# Patient Record
Sex: Female | Born: 1990 | Race: White | Hispanic: No | Marital: Single
Health system: Midwestern US, Community
[De-identification: ages and names within clinical notes are randomized; demographics above are authoritative.]

## PROBLEM LIST (undated history)

## (undated) MED FILL — OMEPRAZOLE 20MG CPDR: 20 MG | 30 days supply | Qty: 30 | Fill #0 | Status: AC

## (undated) MED FILL — ESCITALOPRAM OXALATE 20MG TABS: 20 MG | 90 days supply | Qty: 90 | Fill #0 | Status: AC

## (undated) MED FILL — ESCITALOPRAM OXALATE 20MG TABS: 20 MG | 90 days supply | Qty: 90 | Fill #2 | Status: AC

## (undated) MED FILL — NIKKI 3-0.02MG TABS: 3-0.02 MG | 28 days supply | Qty: 28 | Fill #0 | Status: AC

## (undated) MED FILL — NIKKI 3-0.02MG TABS: 3-0.02 MG | 28 days supply | Qty: 28 | Fill #5 | Status: AC

## (undated) MED FILL — NIKKI 3-0.02MG TABS: 3-0.02 MG | 28 days supply | Qty: 28 | Fill #1 | Status: AC

## (undated) MED FILL — NIKKI 3-0.02MG TABS: 3-0.02 MG | 28 days supply | Qty: 28 | Fill #6 | Status: AC

## (undated) MED FILL — ESCITALOPRAM OXALATE 20MG TABS: 20 MG | 90 days supply | Qty: 90 | Fill #1 | Status: AC

---

## 2016-10-25 ENCOUNTER — Emergency Department (HOSPITAL_BASED_OUTPATIENT_CLINIC_OR_DEPARTMENT_OTHER)
Admission: EM | Admit: 2016-10-25 | Discharge: 2016-10-26 | Disposition: A | Discharge status: Home / Self Care | Payer: BLUE CROSS/BLUE SHIELD | Attending: Emergency Medicine | Admitting: Emergency Medicine

## 2016-10-25 ENCOUNTER — Encounter (HOSPITAL_BASED_OUTPATIENT_CLINIC_OR_DEPARTMENT_OTHER): Payer: Self-pay

## 2016-10-25 ENCOUNTER — Emergency Department (HOSPITAL_BASED_OUTPATIENT_CLINIC_OR_DEPARTMENT_OTHER): Payer: BLUE CROSS/BLUE SHIELD

## 2016-10-25 DIAGNOSIS — S99912A Unspecified injury of left ankle, initial encounter: Secondary | ICD-10-CM | POA: Diagnosis present

## 2016-10-25 DIAGNOSIS — Z79899 Other long term (current) drug therapy: Secondary | ICD-10-CM | POA: Diagnosis not present

## 2016-10-25 DIAGNOSIS — Y999 Unspecified external cause status: Secondary | ICD-10-CM | POA: Diagnosis not present

## 2016-10-25 DIAGNOSIS — S93492A Sprain of other ligament of left ankle, initial encounter: Secondary | ICD-10-CM | POA: Insufficient documentation

## 2016-10-25 DIAGNOSIS — W1840XA Slipping, tripping and stumbling without falling, unspecified, initial encounter: Secondary | ICD-10-CM | POA: Insufficient documentation

## 2016-10-25 DIAGNOSIS — Y929 Unspecified place or not applicable: Secondary | ICD-10-CM | POA: Diagnosis not present

## 2016-10-25 DIAGNOSIS — Y9301 Activity, walking, marching and hiking: Secondary | ICD-10-CM | POA: Insufficient documentation

## 2016-10-25 MED ORDER — NAPROXEN 250 MG PO TABS
500.0000 mg | ORAL_TABLET | Freq: Once | ORAL | Status: AC
  Administered 2016-10-25: 500 mg via ORAL
  Filled 2016-10-25: qty 2

## 2016-10-25 MED ORDER — HYDROCODONE-ACETAMINOPHEN 5-325 MG PO TABS
1.0000 | ORAL_TABLET | Freq: Once | ORAL | Status: AC
  Administered 2016-10-25: 1 via ORAL
  Filled 2016-10-25: qty 1

## 2016-10-25 MED ORDER — IBUPROFEN 600 MG PO TABS: 600.0000 mg | ORAL_TABLET | Freq: Four times a day (QID) | ORAL | 0 refills | Status: AC | PRN

## 2016-10-25 NOTE — ED Triage Notes (Signed)
Left ankle pain since hiking yesterday. (+)swelling, pain and bruising

## 2016-10-25 NOTE — ED Provider Notes (Signed)
[facility] Provider Note   CSN: [id number] Arrival date & time: 10/25/16  2140   By signing my name below, I, Clovis Pu, attest that this documentation has been prepared under the direction and in the presence of Shon Baton, MD  Electronically Signed: Clovis Pu, ED Scribe. 10/25/16. 11:27 PM.   History   Chief Complaint Chief Complaint  Patient presents with  . Ankle Pain   The history is provided by the patient. No language interpreter was used.   HPI Comments:  Amy Erickson is a 26 y.o. female who presents to the Emergency Department complaining of acute onset, worsened, moderate, left ankle pain s/p an incident which occurred yesterday. She notes her pain severity is a "3/10" at rest and a "7/10" during ambulation. Pt notes she was hiking, tripped over a rock and rolled her ankle. Her pain is worse with ambulation. She has taken ibuprofen with no relief. Pt denies knee pain, any major medical problems or any other associated symptoms.   History reviewed. No pertinent past medical history.  There are no active problems to display for this patient.   History reviewed. No pertinent surgical history.  OB History    No data available       Home Medications    Prior to Admission medications   Medication Sig Start Date End Date Taking? Authorizing Provider  PARoxetine (PAXIL) 20 MG tablet Take 20 mg by mouth daily.   Yes Historical Provider, MD  phentermine 30 MG capsule Take 30 mg by mouth every morning.   Yes Historical Provider, MD  ibuprofen (ADVIL,MOTRIN) 600 MG tablet Take 1 tablet (600 mg total) by mouth every 6 (six) hours as needed. 10/25/16   Shon Baton, MD    Family History No family history on file.  Social History Social History  Substance Use Topics  . Smoking status: Never Smoker  . Smokeless tobacco: Never Used  . Alcohol use Yes     Allergies   Patient has no known allergies.   Review of Systems Review of  Systems  Musculoskeletal: Positive for joint swelling and myalgias.  Skin: Positive for color change.  All other systems reviewed and are negative.  Physical Exam Updated Vital Signs BP (!) 128/95 (BP Location: Left Arm)   Pulse 86   Temp 98.2 F (36.8 C) (Oral)   Resp 18   Ht  (1.626 m)   Wt 175 lb (79.4 kg)   LMP 09/27/2016   SpO2 100%   BMI 30.04 kg/m   Physical Exam  Constitutional: She is oriented to person, place, and time. She appears well-developed and well-nourished. No distress.  HENT:  Head: Normocephalic and atraumatic.  Cardiovascular: Normal rate.   Pulmonary/Chest: Effort normal. No respiratory distress.  Musculoskeletal:  Focused examination of the left ankle reveals decreased range of motion, there is extensive swelling and bruising from the lateral aspect of the foot superiorly just above the lateral malleolus, no proximal fibular tenderness, 2+ DP pulse, Achilles tendon intact  Neurological: She is alert and oriented to person, place, and time.  Skin: Skin is warm and dry.  Psychiatric: She has a normal mood and affect.  Nursing note and vitals reviewed.    ED Treatments / Results  DIAGNOSTIC STUDIES:  Oxygen Saturation is 100% on RA, normal by my interpretation.    COORDINATION OF CARE:  11:26 PM Discussed treatment plan with pt at bedside and pt agreed to plan.  Labs (all labs ordered are listed,  but only abnormal results are displayed) Labs Reviewed - No data to display  EKG  EKG Interpretation None       Radiology Dg Ankle Complete Left  Result Date: 10/25/2016 CLINICAL DATA:  Ankle injury while hiking EXAM: LEFT ANKLE COMPLETE - 3+ VIEW COMPARISON:  None. FINDINGS: There is circumferential soft tissue swelling at the left ankle. The ankle mortise is approximated. He there is no fracture or dislocation. No ankle effusion. IMPRESSION: No acute fracture or dislocation of the left ankle. Electronically Signed   By: Deatra Robinson M.D.    On: 10/25/2016 22:46   Dg Foot Complete Left  Result Date: 10/25/2016 CLINICAL DATA:  Ankle injury while hiking EXAM: LEFT FOOT - COMPLETE 3+ VIEW COMPARISON:  None. FINDINGS: There is no evidence of fracture or dislocation. There is no evidence of arthropathy or other focal bone abnormality. Soft tissues are unremarkable. IMPRESSION: No fracture or dislocation of the left foot. Electronically Signed   By: Deatra Robinson M.D.   On: 10/25/2016 22:47    Procedures Procedures (including critical care time)  Medications Ordered in ED Medications  HYDROcodone-acetaminophen (NORCO/VICODIN) 5-325 MG per tablet 1 tablet (1 tablet Oral Given 10/25/16 2332)  naproxen (NAPROSYN) tablet 500 mg (500 mg Oral Given 10/25/16 2332)     Initial Impression / Assessment and Plan / ED Course  I have reviewed the triage vital signs and the nursing notes.  Pertinent labs & imaging results that were available during my care of the patient were reviewed by me and considered in my medical decision making (see chart for details).     Patient presents with left ankle injury. This occurred yesterday. Significant swelling and bruising over the lateral aspect of the ankle. X-rays are negative. Suspect high ankle sprain. Recommend rest, ice, compression, elevation. Patient was placed in an ASO brace. She was also given crutches for comfort. She is not from [place]. Follow-up with primary physician or sports medicine in hometown if symptoms worsen.  After history, exam, and medical workup I feel the patient has been appropriately medically screened and is safe for discharge home. Pertinent diagnoses were discussed with the patient. Patient was given return precautions.   Final Clinical Impressions(s) / ED Diagnoses   Final diagnoses:  Sprain of anterior talofibular ligament of left ankle, initial encounter    New Prescriptions New Prescriptions   IBUPROFEN (ADVIL,MOTRIN) 600 MG TABLET    Take 1 tablet (600 mg  total) by mouth every 6 (six) hours as needed.   I personally performed the services described in this documentation, which was scribed in my presence. The recorded information has been reviewed and is accurate.     Shon Baton, MD 10/26/16 0000

## 2016-10-25 NOTE — Discharge Instructions (Signed)
You likely have a high ankle sprain. Given the extent of swelling and bruising, recommend minimal weightbearing for the next 1-2 days with rest, ice, compression, and elevation. He will be given a brace and crutches. Following that timeframe, weight-bear as tolerated. Follow-up with sports medicine in your hometown for repeat evaluation.

## 2018-01-18 ENCOUNTER — Ambulatory Visit: Admit: 2018-01-18 | Payer: PRIVATE HEALTH INSURANCE | Attending: Family | Primary: Family Medicine

## 2018-01-18 DIAGNOSIS — H6593 Unspecified nonsuppurative otitis media, bilateral: Secondary | ICD-10-CM

## 2018-01-18 MED ORDER — AMOXICILLIN 875 MG TAB
875 mg | ORAL_TABLET | Freq: Two times a day (BID) | ORAL | 0 refills | Status: AC
Start: 2018-01-18 — End: 2018-01-28

## 2018-01-18 MED ORDER — LEVOCETIRIZINE 5 MG TAB
5 mg | ORAL_TABLET | Freq: Every day | ORAL | 0 refills | Status: AC
Start: 2018-01-18 — End: 2018-02-17

## 2018-01-18 NOTE — Progress Notes (Signed)
Identified pt with two pt identifiers(name and DOB). Reviewed record in preparation for visit and have obtained necessary documentation.  Chief Complaint   Patient presents with   . Sinus Infection     X 6 days      Visit Vitals  BP 118/80 (BP 1 Location: Left arm, BP Patient Position: Sitting)   Pulse 92   Temp 97.7 F (36.5 C) (Oral)   Resp 18   Ht 5\' 4"  (1.626 m)   Wt 207 lb (93.9 kg)   LMP 01/18/2018   SpO2 98%   BMI 35.53 kg/m        Health Maintenance Due   Topic   . HPV Age 9Y-26Y (1 - Female 3-dose series)   . DTaP/Tdap/Td series (1 - Tdap)   . PAP AKA CERVICAL CYTOLOGY        Coordination of Care Questionnaire:  :   1) Have you been to an emergency room, urgent care, or hospitalized since your last visit?  If yes, where when, and reason for visit? no       2. Have seen or consulted any other health care provider since your last visit?   If yes, where when, and reason for visit?  NO      3) Do you have an Advanced Directive/ Living Will in place? NO  If yes, do we have a copy on file NO  If no, would you like information NO    Patient is accompanied by self I have received verbal consent from Rebekah Kirby to discuss any/all medical information while they are present in the room.

## 2018-01-18 NOTE — Progress Notes (Signed)
Subjective:      Rebekah Kirby is a 27 y.o. female who presents for possible ear infection. Symptoms include bilateral ear pain. Onset of symptoms was 6 days ago, gradually worsening since that time. Associated symptoms include bilateral ear pressure/pain, congestion and facial pain, which have been present for 6 days . She is drinking plenty of fluids.  History reviewed. No pertinent past medical history.  Current Outpatient Medications   Medication Sig Dispense Refill   ??? escitalopram oxalate (LEXAPRO) 20 mg tablet Take 20 mg by mouth daily.     ??? drospirenone-ethinyl estradiol (NIKKI, 28,) 3-0.02 mg tab Take  by mouth daily.     ??? amoxicillin (AMOXIL) 875 mg tablet Take 1 Tab by mouth two (2) times a day for 10 days. 20 Tab 0   ??? levocetirizine (XYZAL) 5 mg tablet Take 1 Tab by mouth daily for 30 days. 30 Tab 0     Allergies   Allergen Reactions   ??? Bactrim [Sulfamethoprim] Hives     Social History     Socioeconomic History   ??? Marital status: SINGLE     Spouse name: Not on file   ??? Number of children: Not on file   ??? Years of education: Not on file   ??? Highest education level: Not on file   Occupational History   ??? Not on file   Social Needs   ??? Financial resource strain: Not on file   ??? Food insecurity:     Worry: Not on file     Inability: Not on file   ??? Transportation needs:     Medical: Not on file     Non-medical: Not on file   Tobacco Use   ??? Smoking status: Former Smoker   ??? Smokeless tobacco: Never Used   Substance and Sexual Activity   ??? Alcohol use: Yes     Alcohol/week: 0.6 oz     Types: 1 Glasses of wine per week     Frequency: Never     Drinks per session: 1 or 2     Binge frequency: Never   ??? Drug use: Not on file   ??? Sexual activity: Yes     Partners: Male     Birth control/protection: Pill   Lifestyle   ??? Physical activity:     Days per week: Not on file     Minutes per session: Not on file   ??? Stress: Not on file   Relationships   ??? Social connections:     Talks on phone: Not on file     Gets  together: Not on file     Attends religious service: Not on file     Active member of club or organization: Not on file     Attends meetings of clubs or organizations: Not on file     Relationship status: Not on file   ??? Intimate partner violence:     Fear of current or ex partner: Not on file     Emotionally abused: Not on file     Physically abused: Not on file     Forced sexual activity: Not on file   Other Topics Concern   ??? Not on file   Social History Narrative   ??? Not on file     Review of Systems  A comprehensive review of systems was negative except for that written in the HPI.    Objective:     Visit Vitals  BP 118/80 (  BP 1 Location: Left arm, BP Patient Position: Sitting)   Pulse 92   Temp 97.7 ??F (36.5 ??C) (Oral)   Resp 18   Ht 5\' 4"  (1.626 m)   Wt 207 lb (93.9 kg)   LMP 01/18/2018   SpO2 98%   BMI 35.53 kg/m??     General:  alert, cooperative, no distress, appears stated age   Head:  NCAT w/o lesions or tenderness   Eyes: conjunctivae/corneas clear. PERRL, EOM's intact. Fundi benign   Right Ear: right TM erythematous, dull   Left Ear: left TM erythematous, dull   Mouth:  Lips, mucosa, and tongue normal. Teeth and gums normal   Neck: supple, symmetrical, trachea midline, no adenopathy, thyroid: not enlarged, symmetric, no tenderness/mass/nodules, no carotid bruit and no JVD.   Lungs: clear to auscultation bilaterally   Heart:  regular rate and rhythm, S1, S2 normal, no murmur, click, rub or gallop   Skin: no rash or abnormalities        Assessment/Plan:     Acute bilateral otitis media    1.  Treatment:  Amoxicillin  2.  OTC meds (ibuprofen- doses discussed), fluids, rest, avoid carbonated/ alcoholic, and caffeinated beverages.   3.  Follow up with PCP in 1 week if not improving.      ICD-10-CM ICD-9-CM    1. Bilateral non-suppurative otitis media H65.93 381.4 amoxicillin (AMOXIL) 875 mg tablet      levocetirizine (XYZAL) 5 mg tablet     reviewed diet, exercise and weight control  reviewed medications and  side effects in detail.    Visit Vitals  BP 118/80 (BP 1 Location: Left arm, BP Patient Position: Sitting)   Pulse 92   Temp 97.7 ??F (36.5 ??C) (Oral)   Resp 18   Ht 5\' 4"  (1.626 m)   Wt 207 lb (93.9 kg)   LMP 01/18/2018   SpO2 98%   BMI 35.53 kg/m??     Spoke with the patient regarding their blood pressure (BP) reading at today's visit.  The patient verbalized understanding of need to maintain BP lower than 140/90.  The patient will follow up with their primary care physician regarding management and/or medications that may be needed.    Drepssion Screening has been completed.  The patient isreports having a history of depression.  The patient istaking medication and is being followed by their PCP at this time.    This patient does  have a primary care physician.  Referral was not given a referral at todays visit.    Immunizations:  The patient is current on their influenza immunization at this time.  The patient does not   want to receive the influenza immunization today.    Follow up instructions given at today's visit were verbalized by the patient/parent.  The signs of infection are fever > 100.4, increase fatigue, change in mental status, or decrease in urinary output.  The patient/parent verbalized understanding of taking medications prescribed during this visit as prescribed.

## 2018-01-18 NOTE — Progress Notes (Signed)
Subjective:      Rebekah Kirby is a 27 y.o. female who presents for possible ear infection. Symptoms include bilateral ear pain. Onset of symptoms was 6 days ago, gradually worsening since that time. Associated symptoms include bilateral ear pressure/pain, congestion and facial pain, which have been present for 6 days . She is drinking plenty of fluids.  History reviewed. No pertinent past medical history.  Current Outpatient Medications   Medication Sig Dispense Refill   ??? escitalopram oxalate (LEXAPRO) 20 mg tablet Take 20 mg by mouth daily.     ??? drospirenone-ethinyl estradiol (NIKKI, 28,) 3-0.02 mg tab Take  by mouth daily.     ??? amoxicillin (AMOXIL) 875 mg tablet Take 1 Tab by mouth two (2) times a day for 10 days. 20 Tab 0   ??? levocetirizine (XYZAL) 5 mg tablet Take 1 Tab by mouth daily for 30 days. 30 Tab 0     Allergies   Allergen Reactions   ??? Bactrim [Sulfamethoprim] Hives     Social History     Socioeconomic History   ??? Marital status: SINGLE     Spouse name: Not on file   ??? Number of children: Not on file   ??? Years of education: Not on file   ??? Highest education level: Not on file   Occupational History   ??? Not on file   Social Needs   ??? Financial resource strain: Not on file   ??? Food insecurity:     Worry: Not on file     Inability: Not on file   ??? Transportation needs:     Medical: Not on file     Non-medical: Not on file   Tobacco Use   ??? Smoking status: Former Smoker   ??? Smokeless tobacco: Never Used   Substance and Sexual Activity   ??? Alcohol use: Yes     Alcohol/week: 0.6 oz     Types: 1 Glasses of wine per week     Frequency: Never     Drinks per session: 1 or 2     Binge frequency: Never   ??? Drug use: Not on file   ??? Sexual activity: Yes     Partners: Male     Birth control/protection: Pill   Lifestyle   ??? Physical activity:     Days per week: Not on file     Minutes per session: Not on file   ??? Stress: Not on file   Relationships   ??? Social connections:     Talks on phone: Not on file      Gets together: Not on file     Attends religious service: Not on file     Active member of club or organization: Not on file     Attends meetings of clubs or organizations: Not on file     Relationship status: Not on file   ??? Intimate partner violence:     Fear of current or ex partner: Not on file     Emotionally abused: Not on file     Physically abused: Not on file     Forced sexual activity: Not on file   Other Topics Concern   ??? Not on file   Social History Narrative   ??? Not on file     Review of Systems  A comprehensive review of systems was negative except for that written in the HPI.    Objective:     Visit Vitals  BP 118/80 (  BP 1 Location: Left arm, BP Patient Position: Sitting)   Pulse 92   Temp 97.7 ??F (36.5 ??C) (Oral)   Resp 18   Ht 5\' 4"  (1.626 m)   Wt 207 lb (93.9 kg)   LMP 01/18/2018   SpO2 98%   BMI 35.53 kg/m??     General:  alert, cooperative, no distress, appears stated age   Head:  NCAT w/o lesions or tenderness   Eyes: conjunctivae/corneas clear. PERRL, EOM's intact. Fundi benign   Right Ear: right TM erythematous, dull   Left Ear: left TM erythematous, dull   Mouth:  Lips, mucosa, and tongue normal. Teeth and gums normal   Neck: supple, symmetrical, trachea midline, no adenopathy, thyroid: not enlarged, symmetric, no tenderness/mass/nodules, no carotid bruit and no JVD.   Lungs: clear to auscultation bilaterally   Heart:  regular rate and rhythm, S1, S2 normal, no murmur, click, rub or gallop   Skin: no rash or abnormalities        Assessment/Plan:     Acute bilateral otitis media    1.  Treatment:  Amoxicillin  2.  OTC meds (ibuprofen- doses discussed), fluids, rest, avoid carbonated/ alcoholic, and caffeinated beverages.   3.  Follow up with PCP in 1 week if not improving.      ICD-10-CM ICD-9-CM    1. Bilateral non-suppurative otitis media H65.93 381.4 amoxicillin (AMOXIL) 875 mg tablet      levocetirizine (XYZAL) 5 mg tablet     reviewed diet, exercise and weight control   reviewed medications and side effects in detail.    Visit Vitals  BP 118/80 (BP 1 Location: Left arm, BP Patient Position: Sitting)   Pulse 92   Temp 97.7 ??F (36.5 ??C) (Oral)   Resp 18   Ht 5\' 4"  (1.626 m)   Wt 207 lb (93.9 kg)   LMP 01/18/2018   SpO2 98%   BMI 35.53 kg/m??     Spoke with the patient regarding their blood pressure (BP) reading at today's visit.  The patient verbalized understanding of need to maintain BP lower than 140/90.  The patient will follow up with their primary care physician regarding management and/or medications that may be needed.    Drepssion Screening has been completed.  The patient isreports having a history of depression.  The patient istaking medication and is being followed by their PCP at this time.    This patient does  have a primary care physician.  Referral was not given a referral at todays visit.    Immunizations:  The patient is current on their influenza immunization at this time.  The patient does not   want to receive the influenza immunization today.    Follow up instructions given at today's visit were verbalized by the patient/parent.  The signs of infection are fever > 100.4, increase fatigue, change in mental status, or decrease in urinary output.  The patient/parent verbalized understanding of taking medications prescribed during this visit as prescribed.

## 2018-01-18 NOTE — Patient Instructions (Signed)
Ear Infection (Otitis Media): Care Instructions  Your Care Instructions    An ear infection may start with a cold and affect the middle ear (otitis media). It can hurt a lot. Most ear infections clear up on their own in a couple of days. Most often you will not need antibiotics. This is because many ear infections are caused by a virus. Antibiotics don't work against a virus. Regular doses of pain medicines are the best way to reduce your fever and help you feel better.  Follow-up care is a key part of your treatment and safety. Be sure to make and go to all appointments, and call your doctor if you are having problems. It's also a good idea to know your test results and keep a list of the medicines you take.  How can you care for yourself at home?  ?? Take pain medicines exactly as directed.  ? If the doctor gave you a prescription medicine for pain, take it as prescribed.  ? If you are not taking a prescription pain medicine, take an over-the-counter medicine, such as acetaminophen (Tylenol), ibuprofen (Advil, Motrin), or naproxen (Aleve). Read and follow all instructions on the label.  ? Do not take two or more pain medicines at the same time unless the doctor told you to. Many pain medicines have acetaminophen, which is Tylenol. Too much acetaminophen (Tylenol) can be harmful.  ?? Plan to take a full dose of pain reliever before bedtime. Getting enough sleep will help you get better.  ?? Try a warm, moist washcloth on the ear. It may help relieve pain.  ?? If your doctor prescribed antibiotics, take them as directed. Do not stop taking them just because you feel better. You need to take the full course of antibiotics.  When should you call for help?  Call your doctor now or seek immediate medical care if:  ?? ?? You have new or increasing ear pain.   ?? ?? You have new or increasing pus or blood draining from your ear.   ?? ?? You have a fever with a stiff neck or a severe headache.    ??Watch closely for changes in your health, and be sure to contact your doctor if:  ?? ?? You have new or worse symptoms.   ?? ?? You are not getting better after taking an antibiotic for 2 days.   Where can you learn more?  Go to http://www.healthwise.net/[employer].  Enter X558 in the search box to learn more about "Ear Infection (Otitis Media): Care Instructions."  Current as of: November 03, 2016  Content Version: 11.9  ?? 2006-2018 [employer]. Care instructions adapted under license by [employer] (which disclaims liability or warranty for this information). If you have questions about a medical condition or this instruction, always ask your healthcare professional. [employer] disclaims any warranty or liability for your use of this information.

## 2018-01-18 NOTE — Progress Notes (Signed)
Identified pt with two pt identifiers(name and DOB). Reviewed record in preparation for visit and have obtained necessary documentation.  Chief Complaint   Patient presents with   ??? Sinus Infection     X 6 days      Visit Vitals  BP 118/80 (BP 1 Location: Left arm, BP Patient Position: Sitting)   Pulse 92   Temp 97.7 ??F (36.5 ??C) (Oral)   Resp 18   Ht 5' 4" (1.626 m)   Wt 207 lb (93.9 kg)   LMP 01/18/2018   SpO2 98%   BMI 35.53 kg/m??        Health Maintenance Due   Topic   ??? HPV Age 9Y-26Y (1 - Female 3-dose series)   ??? DTaP/Tdap/Td series (1 - Tdap)   ??? PAP AKA CERVICAL CYTOLOGY        Coordination of Care Questionnaire:  :   1) Have you been to an emergency room, urgent care, or hospitalized since your last visit?  If yes, where when, and reason for visit? no       2. Have seen or consulted any other health care provider since your last visit?   If yes, where when, and reason for visit?  NO      3) Do you have an Advanced Directive/ Living Will in place? NO  If yes, do we have a copy on file NO  If no, would you like information NO    Patient is accompanied by self I have received verbal consent from Rebekah Kirby to discuss any/all medical information while they are present in the room.

## 2018-01-22 IMAGING — DX DG ANKLE COMPLETE 3+V*L*
3 series · 3 of 3 positions shown · non-contrast
Comparison: None.

CLINICAL DATA: Ankle injury while hiking

EXAM:
LEFT ANKLE COMPLETE - 3+ VIEW

[ankle ap]
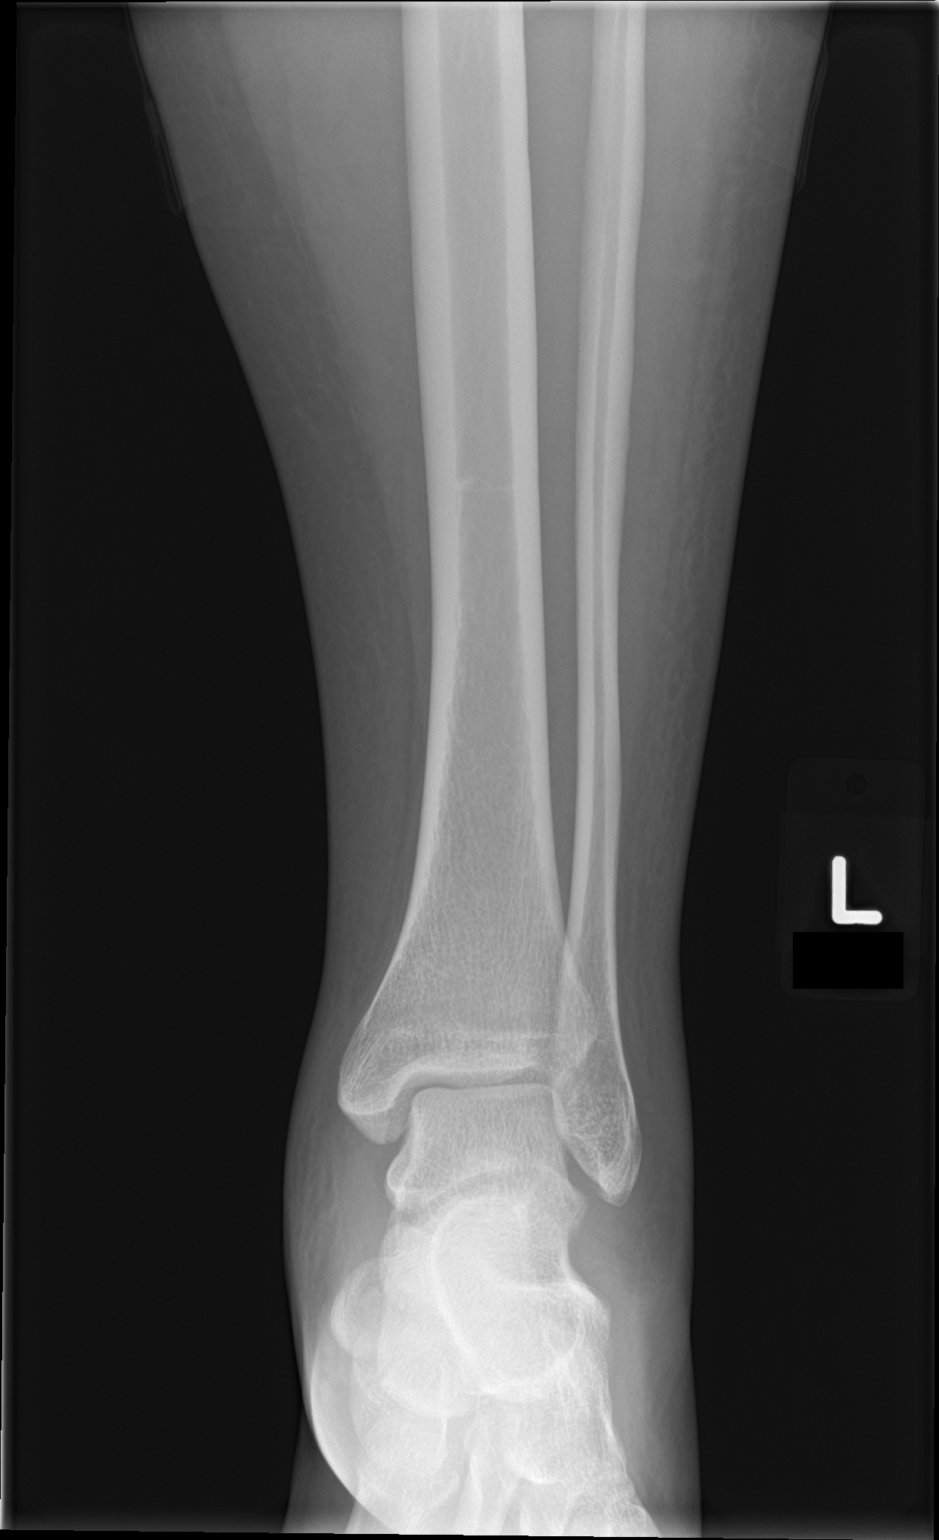

[ankle obl]
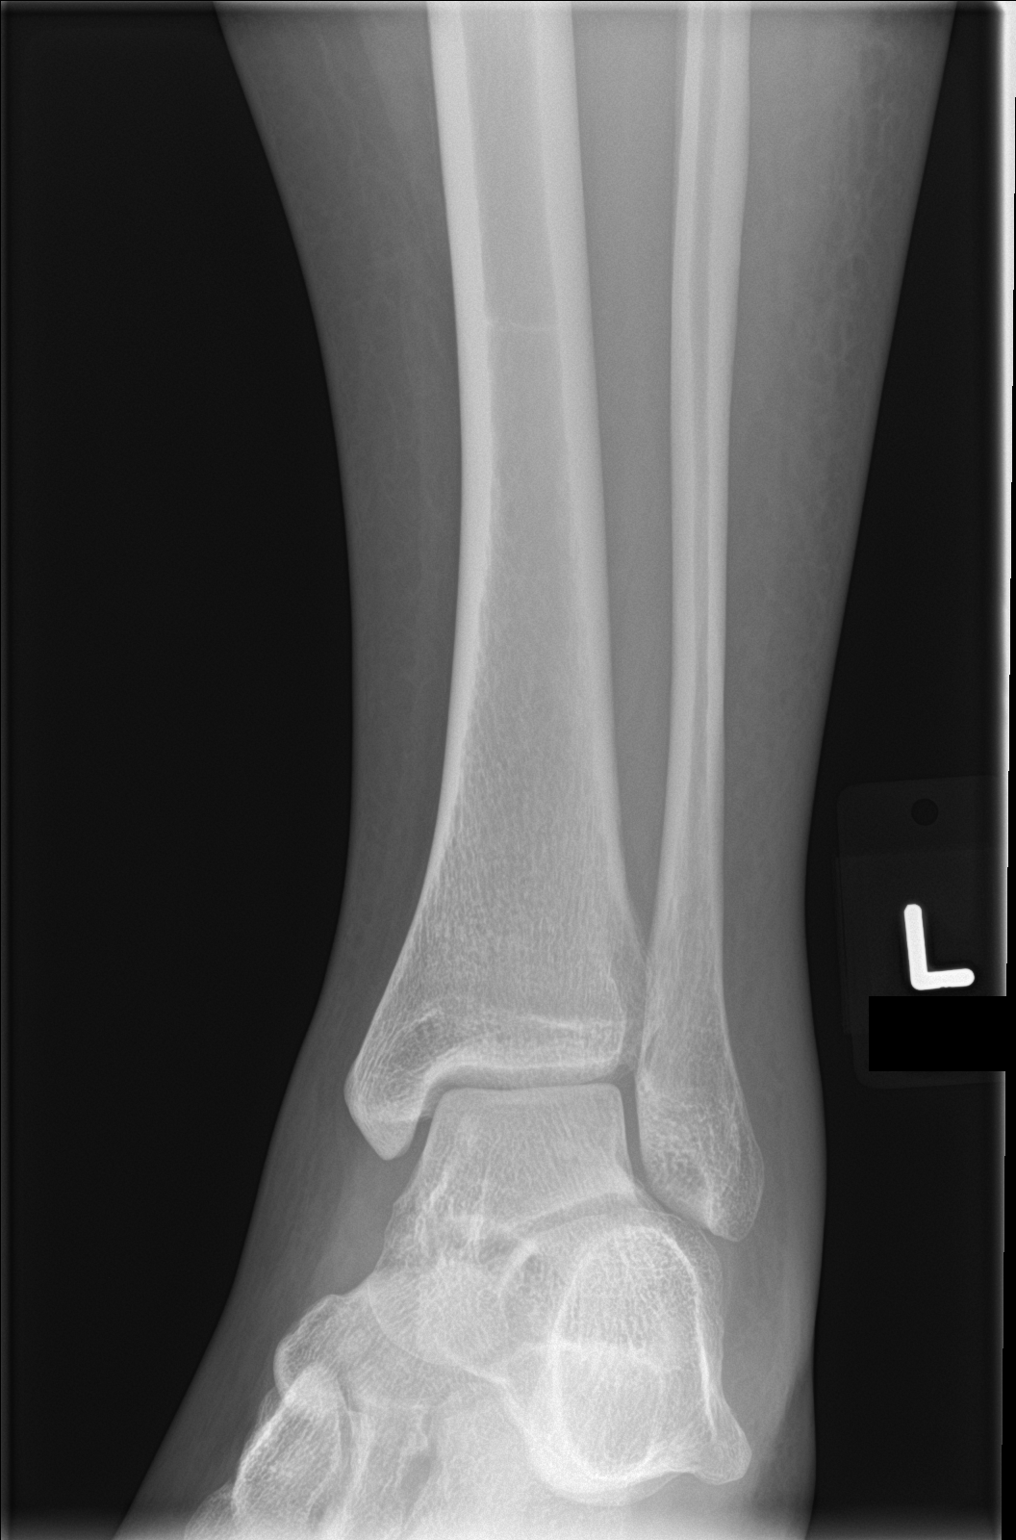

[ankle lat]
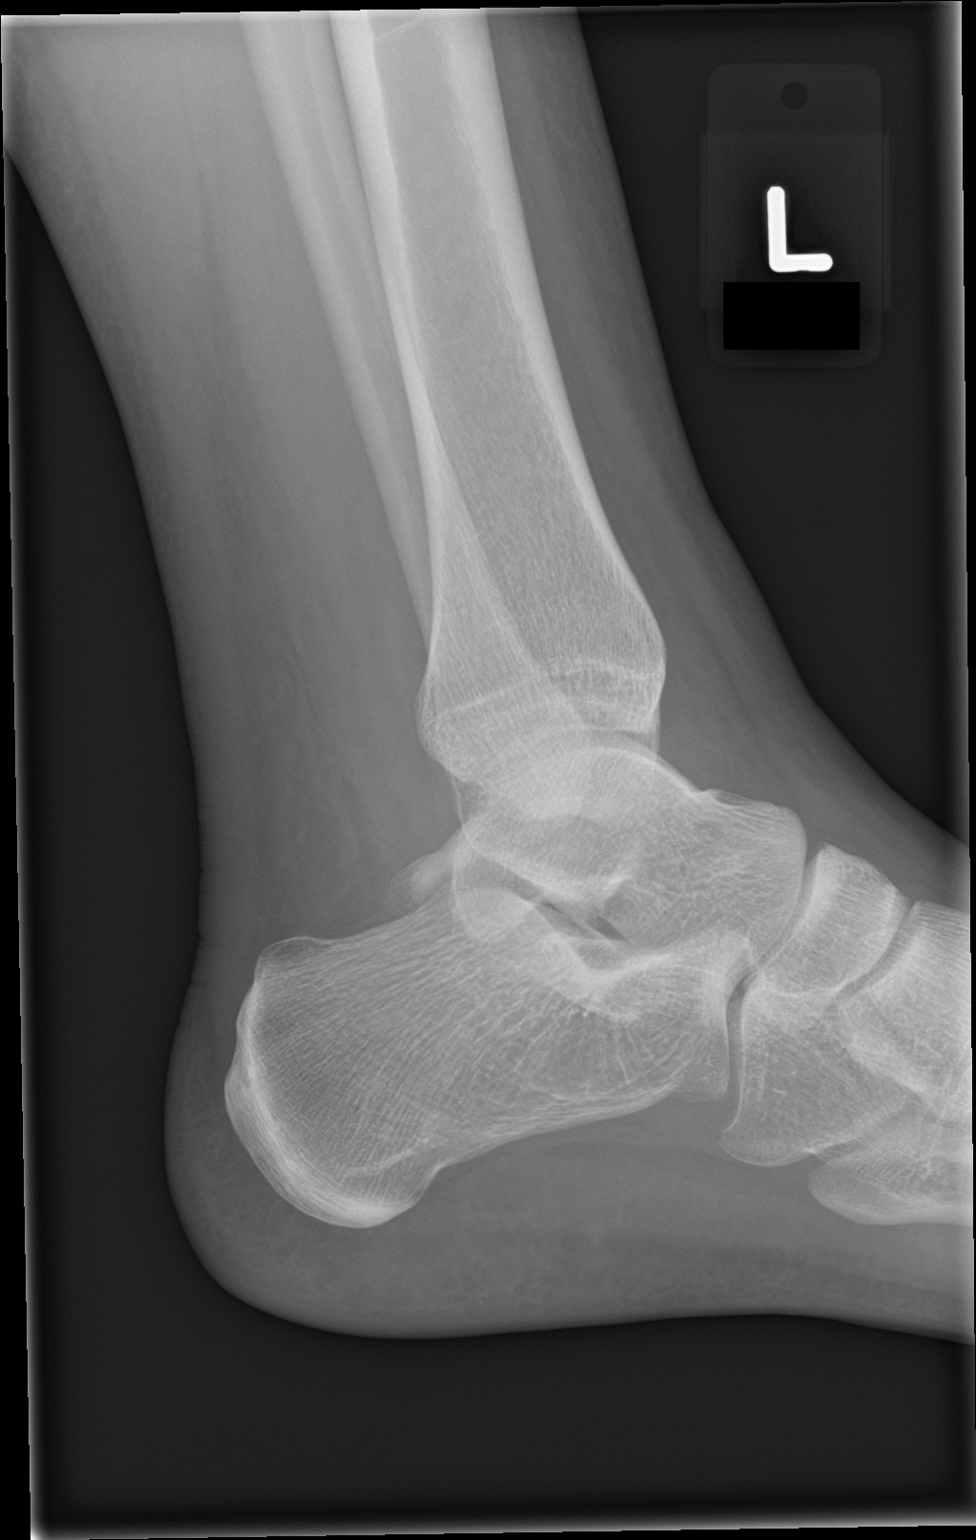

[3 of 3 positions shown; findings below may reference images not displayed]

FINDINGS: There is circumferential soft tissue swelling at the left ankle. The
ankle mortise is approximated. He there is no fracture or
dislocation. No ankle effusion.
IMPRESSION: No acute fracture or dislocation of the left ankle.

## 2018-01-22 IMAGING — DX DG FOOT COMPLETE 3+V*L*
3 series · 3 of 3 positions shown · non-contrast
Comparison: None.

CLINICAL DATA: Ankle injury while hiking

EXAM:
LEFT FOOT - COMPLETE 3+ VIEW

[foot ap]
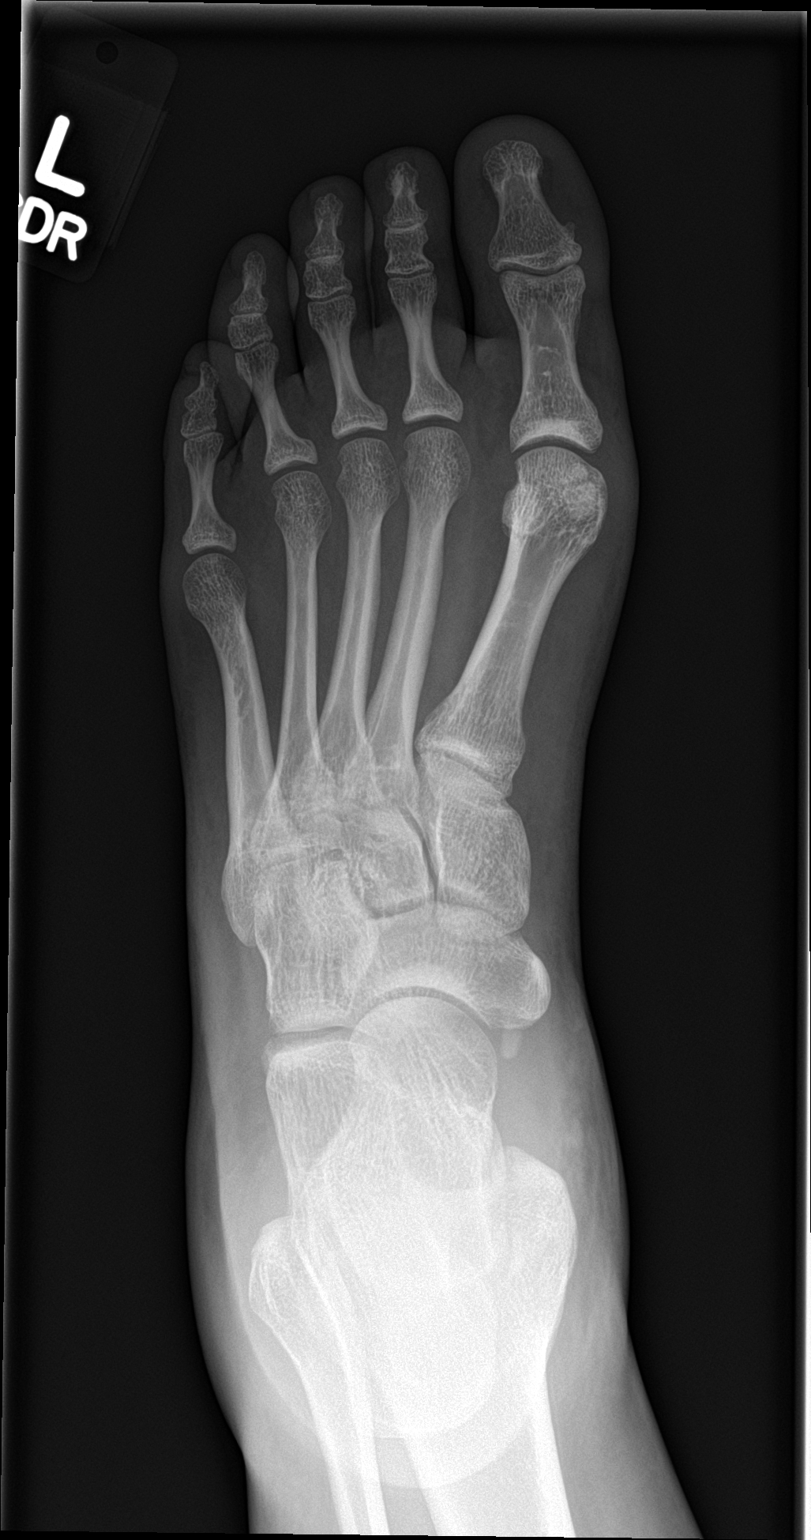

[foot obl]
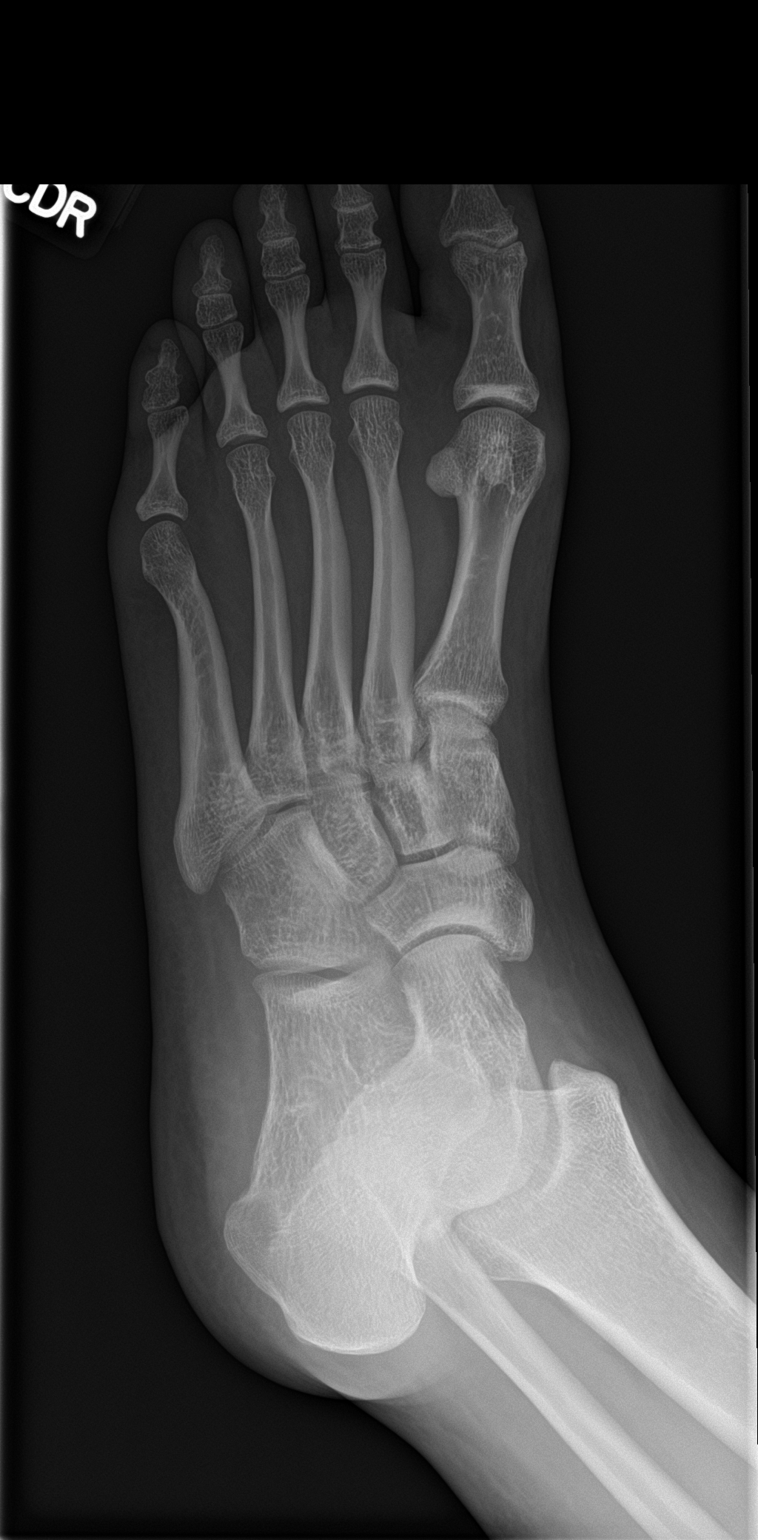

[foot lat]
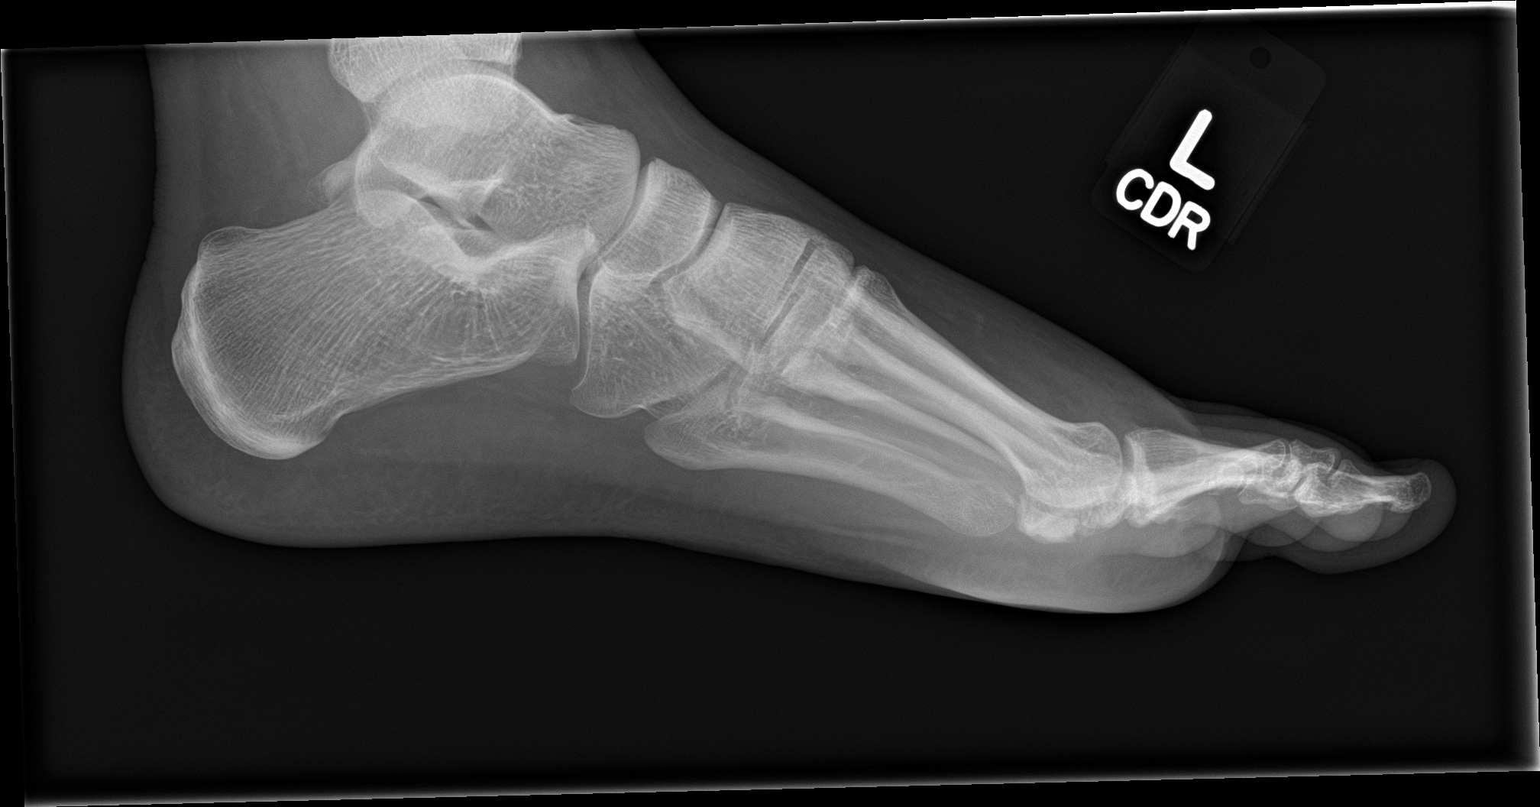

[3 of 3 positions shown; findings below may reference images not displayed]

FINDINGS: There is no evidence of fracture or dislocation. There is no
evidence of arthropathy or other focal bone abnormality. Soft
tissues are unremarkable.
IMPRESSION: No fracture or dislocation of the left foot.

## 2018-12-22 MED FILL — NIKKI 3-0.02MG TABS: 3-0.02 mg | 28 days supply | Qty: 28 | Fill #5 | Status: AC

## 2019-02-07 MED FILL — NIKKI 3-0.02MG TABS: 3-0.02 mg | 28 days supply | Qty: 28 | Fill #6 | Status: AC

## 2019-04-12 ENCOUNTER — Ambulatory Visit: Admit: 2019-04-12 | Discharge: 2019-04-13 | Payer: PRIVATE HEALTH INSURANCE | Primary: Family Medicine

## 2019-04-13 MED FILL — ESCITALOPRAM OXALATE 20MG TABS: 20 mg | 90 days supply | Qty: 90 | Fill #2 | Status: AC

## 2019-07-20 MED FILL — ESCITALOPRAM OXALATE 20MG TABS: 20 mg | 90 days supply | Qty: 90 | Fill #1 | Status: AC

## 2019-10-17 MED FILL — NIKKI 3-0.02MG TABS: 3-0.02 mg | ORAL | 28 days supply | Qty: 28 | Fill #0 | Status: AC

## 2019-10-17 MED FILL — ESCITALOPRAM OXALATE 20MG TABS: 20 mg | ORAL | 90 days supply | Qty: 90 | Fill #0 | Status: AC

## 2019-11-08 MED FILL — OMEPRAZOLE 20MG CPDR: 20 mg | ORAL | 30 days supply | Qty: 30 | Fill #0 | Status: AC

## 2019-11-15 MED FILL — NIKKI 3-0.02MG TABS: 3-0.02 mg | ORAL | 28 days supply | Qty: 28 | Fill #0 | Status: AC

## 2019-12-14 MED FILL — NIKKI 3-0.02MG TABS: 3-0.02 mg | ORAL | 28 days supply | Qty: 28 | Fill #1 | Status: AC
